# Patient Record
Sex: Male | Born: 1980 | Race: White | Hispanic: No | Marital: Single | Smoking: Current every day smoker
Health system: Southern US, Community
[De-identification: ages and names within clinical notes are randomized; demographics above are authoritative.]

## PROBLEM LIST (undated history)

## (undated) DIAGNOSIS — H3320 Serous retinal detachment, unspecified eye: Secondary | ICD-10-CM

## (undated) DIAGNOSIS — J42 Unspecified chronic bronchitis: Secondary | ICD-10-CM

## (undated) DIAGNOSIS — E162 Hypoglycemia, unspecified: Secondary | ICD-10-CM

## (undated) DIAGNOSIS — Z9001 Acquired absence of eye: Secondary | ICD-10-CM

## (undated) DIAGNOSIS — I456 Pre-excitation syndrome: Secondary | ICD-10-CM

## (undated) DIAGNOSIS — J45909 Unspecified asthma, uncomplicated: Secondary | ICD-10-CM

## (undated) HISTORY — PX: CARDIAC CATHETERIZATION: SHX172

## (undated) HISTORY — PX: ENUCLEATION: SHX628

---

## 2009-03-01 ENCOUNTER — Emergency Department: Payer: Self-pay | Admitting: Unknown Physician Specialty

## 2009-05-25 ENCOUNTER — Emergency Department: Payer: Self-pay | Admitting: Emergency Medicine

## 2010-07-11 ENCOUNTER — Emergency Department: Payer: Self-pay | Admitting: Emergency Medicine

## 2011-06-04 ENCOUNTER — Observation Stay: Payer: Self-pay | Admitting: Specialist

## 2011-09-23 ENCOUNTER — Emergency Department: Payer: Self-pay | Admitting: Emergency Medicine

## 2012-01-04 ENCOUNTER — Emergency Department: Payer: Self-pay | Admitting: Emergency Medicine

## 2012-01-04 LAB — CBC
HCT: 44.9 % (ref 40.0–52.0)
HGB: 15.5 g/dL (ref 13.0–18.0)
MCH: 30.3 pg (ref 26.0–34.0)
MCHC: 34.5 g/dL (ref 32.0–36.0)
MCV: 88 fL (ref 80–100)
RBC: 5.12 10*6/uL (ref 4.40–5.90)
RDW: 14.3 % (ref 11.5–14.5)

## 2012-01-04 LAB — COMPREHENSIVE METABOLIC PANEL
Albumin: 3.8 g/dL (ref 3.4–5.0)
Anion Gap: 13 (ref 7–16)
BUN: 11 mg/dL (ref 7–18)
Bilirubin,Total: 0.4 mg/dL (ref 0.2–1.0)
Chloride: 107 mmol/L (ref 98–107)
Co2: 22 mmol/L (ref 21–32)
Creatinine: 1 mg/dL (ref 0.60–1.30)
EGFR (African American): >60
Potassium: 3.7 mmol/L (ref 3.5–5.1)
SGPT (ALT): 35 U/L
Sodium: 142 mmol/L (ref 136–145)
Total Protein: 7 g/dL (ref 6.4–8.2)

## 2012-01-04 LAB — PROTIME-INR: INR: 1

## 2012-01-04 LAB — CK TOTAL AND CKMB (NOT AT ARMC)
CK, Total: 166 U/L (ref 35–232)
CK-MB: 1.1 ng/mL (ref 0.5–3.6)

## 2012-01-04 LAB — APTT: Activated PTT: 39.2 secs — ABNORMAL HIGH (ref 23.6–35.9)

## 2012-01-30 ENCOUNTER — Emergency Department: Payer: Self-pay | Admitting: *Deleted

## 2012-07-16 ENCOUNTER — Emergency Department: Payer: Self-pay | Admitting: Emergency Medicine

## 2013-01-12 ENCOUNTER — Emergency Department: Payer: Self-pay | Admitting: Emergency Medicine

## 2013-01-30 LAB — MAGNESIUM: Magnesium: 2.3 mg/dL

## 2013-01-30 LAB — COMPREHENSIVE METABOLIC PANEL
Alkaline Phosphatase: 90 U/L (ref 50–136)
Anion Gap: 7 (ref 7–16)
BUN: 9 mg/dL (ref 7–18)
Bilirubin,Total: 0.3 mg/dL (ref 0.2–1.0)
Calcium, Total: 8.5 mg/dL (ref 8.5–10.1)
Chloride: 109 mmol/L — ABNORMAL HIGH (ref 98–107)
Co2: 26 mmol/L (ref 21–32)
Creatinine: 0.84 mg/dL (ref 0.60–1.30)
EGFR (African American): >60
EGFR (Non-African Amer.): >60
Glucose: 109 mg/dL — ABNORMAL HIGH (ref 65–99)
Potassium: 3.8 mmol/L (ref 3.5–5.1)
SGOT(AST): 22 U/L (ref 15–37)

## 2013-01-30 LAB — CBC
HGB: 15.3 g/dL (ref 13.0–18.0)
MCH: 30.2 pg (ref 26.0–34.0)
MCHC: 33.9 g/dL (ref 32.0–36.0)
Platelet: 237 10*3/uL (ref 150–440)
RDW: 13.6 % (ref 11.5–14.5)

## 2013-01-30 LAB — CK TOTAL AND CKMB (NOT AT ARMC)
CK, Total: 122 U/L (ref 35–232)
CK-MB: 0.7 ng/mL (ref 0.5–3.6)

## 2013-01-30 LAB — LIPASE, BLOOD: Lipase: 225 U/L (ref 73–393)

## 2013-01-31 LAB — DRUG SCREEN, URINE
Barbiturates, Ur Screen: NEGATIVE (ref ?–200)
Cocaine Metabolite,Ur ~~LOC~~: NEGATIVE (ref ?–300)
MDMA (Ecstasy)Ur Screen: NEGATIVE (ref ?–500)
Tricyclic, Ur Screen: NEGATIVE (ref ?–1000)

## 2013-01-31 LAB — URINALYSIS, COMPLETE
Bilirubin,UR: NEGATIVE
Blood: NEGATIVE
Glucose,UR: NEGATIVE mg/dL (ref 0–75)
Leukocyte Esterase: NEGATIVE
Nitrite: NEGATIVE
WBC UR: <1 /HPF (ref 0–5)

## 2013-02-01 ENCOUNTER — Inpatient Hospital Stay: Payer: Self-pay | Admitting: Internal Medicine

## 2013-02-01 LAB — HEMOGLOBIN A1C: Hemoglobin A1C: 5.4 % (ref 4.2–6.3)

## 2013-02-01 LAB — FOLATE: Folic Acid: 6.5 ng/mL (ref 3.1–100.0)

## 2013-04-13 ENCOUNTER — Emergency Department: Payer: Self-pay | Admitting: Emergency Medicine

## 2013-04-13 LAB — BASIC METABOLIC PANEL
Anion Gap: 5 — ABNORMAL LOW (ref 7–16)
BUN: 12 mg/dL (ref 7–18)
Creatinine: 1.52 mg/dL — ABNORMAL HIGH (ref 0.60–1.30)
EGFR (African American): >60
EGFR (Non-African Amer.): >60
Glucose: 104 mg/dL — ABNORMAL HIGH (ref 65–99)
Osmolality: 283 (ref 275–301)
Sodium: 142 mmol/L (ref 136–145)

## 2013-04-13 LAB — URINALYSIS, COMPLETE
Bacteria: NONE SEEN
Blood: NEGATIVE
Glucose,UR: NEGATIVE mg/dL (ref 0–75)
Ketone: NEGATIVE
Leukocyte Esterase: NEGATIVE
Ph: 6 (ref 4.5–8.0)
Protein: NEGATIVE
Squamous Epithelial: <1
WBC UR: 1 /HPF (ref 0–5)

## 2013-04-13 LAB — CBC
HCT: 47.3 % (ref 40.0–52.0)
HGB: 16 g/dL (ref 13.0–18.0)
MCV: 88 fL (ref 80–100)
Platelet: 233 10*3/uL (ref 150–440)
RBC: 5.36 10*6/uL (ref 4.40–5.90)
WBC: 9.8 10*3/uL (ref 3.8–10.6)

## 2013-06-17 ENCOUNTER — Emergency Department: Payer: Self-pay | Admitting: Emergency Medicine

## 2014-10-19 ENCOUNTER — Emergency Department: Payer: Self-pay | Admitting: Emergency Medicine

## 2014-10-19 LAB — BASIC METABOLIC PANEL
Anion Gap: 4 — ABNORMAL LOW (ref 7–16)
BUN: 8 mg/dL (ref 7–18)
CALCIUM: 8.3 mg/dL — AB (ref 8.5–10.1)
CHLORIDE: 107 mmol/L (ref 98–107)
Co2: 29 mmol/L (ref 21–32)
Creatinine: 0.96 mg/dL (ref 0.60–1.30)
EGFR (Non-African Amer.): >60
Glucose: 115 mg/dL — ABNORMAL HIGH (ref 65–99)
OSMOLALITY: 279 (ref 275–301)
Potassium: 3.8 mmol/L (ref 3.5–5.1)
Sodium: 140 mmol/L (ref 136–145)

## 2014-10-19 LAB — CBC WITH DIFFERENTIAL/PLATELET
BASOS ABS: 0.2 10*3/uL — AB (ref 0.0–0.1)
Basophil %: 1.6 %
EOS ABS: 0 10*3/uL (ref 0.0–0.7)
EOS PCT: 0.1 %
HCT: 48.9 % (ref 40.0–52.0)
HGB: 16 g/dL (ref 13.0–18.0)
LYMPHS ABS: 0.9 10*3/uL — AB (ref 1.0–3.6)
Lymphocyte %: 7.1 %
MCH: 29.6 pg (ref 26.0–34.0)
MCHC: 32.6 g/dL (ref 32.0–36.0)
MCV: 91 fL (ref 80–100)
MONO ABS: 0.7 x10 3/mm (ref 0.2–1.0)
MONOS PCT: 5.4 %
NEUTROS ABS: 10.4 10*3/uL — AB (ref 1.4–6.5)
Neutrophil %: 85.8 %
PLATELETS: 189 10*3/uL (ref 150–440)
RBC: 5.4 10*6/uL (ref 4.40–5.90)
RDW: 13.2 % (ref 11.5–14.5)
WBC: 12.1 10*3/uL — AB (ref 3.8–10.6)
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
## 2015-02-16 NOTE — Discharge Summary (Signed)
PATIENT NAME:  Drexel IhaRKER, Jag MR#:  [id number] DATE OF BIRTH:  1981-04-18  DATE OF ADMISSION:  02/01/2013 DATE OF DISCHARGE:  02/02/2013  ADMISSION DIAGNOSIS:  Bilateral lower extremity weakness.   DISCHARGE DIAGNOSES: 1.  Bilateral lower extremity weakness with lumbago. 2.  History of Wolff-Parkinson-White.   CONSULTS: Neurology.   IMAGING: The patient had a MRI of the cervical spine and thoracic spine that showed no acute fracture or acute pathology.  MRI of the brain showed no acute intracranial hemorrhage. It did show left globe abnormal signal. A process such as retinal detachment and hemorrhage could present in this fashion. MRI of the lumbar spine mild L4-L5 disk space degeneration with tiny central disk protrusion. Vitamin D level was low at 12.4. Vitamin B12 is 375. Folic acid is 6.5.   HOSPITAL COURSE: This is a 34 year old male, who presented with lower extremity weakness. For further details, please refer to the history and physical.  1.  Lower extremity weakness and lumbago. The patient altering physical exam findings. He was complaining of back pain and back spasms and with that he had associated lower extremity weakness. He was consulted by neurology. He had several imaging studies including MRI of the thoracic, cervical and lumbar spine, all of which are pretty unremarkable. His MRI showed no acute intracranial hemorrhage or plaques; however, he did have some abnormal findings in the left eye. He has had issues with the left eye. He says he cannot see out of it for some time now. We did request follow up with outpatient ophthalmology for that. He was seen by physical therapy. He actually was walking quite well with a walker. We did start baclofen for his muscle spasms.  2.  History of WPW, status post ablation.  3.  Vitamin D deficiency. We did discharge him with vitamin D oral replacement.   DISCHARGE MEDICATIONS:  1.  Baclofen 5 mg t.i.d.  2.  Percocet 1 tablet q. 8 hours p.r.n.  pain, 5/325 mg.  3.  Vitamin D 400 international units 2 tablets daily.   Discharged with home health physical therapy for gait training.   DISCHARGE DIET: Regular.   DISCHARGE FOLLOWUP: The patient will follow up with Dr. Cristopher PeruHemang Shah in 1 to 2 weeks. He will also need a primary care physician and he will also follow up in 1 week with ophthalmology.   The patient was stable for discharge with home health.  TIME SPENT: 35 minutes.   ____________________________ Janyth ContesSital P. Juliene PinaMody, MD spm:aw D: 02/03/2013 13:27:30 ET T: 02/03/2013 13:43:49 ET JOB#: [id number]  cc: Deasia Chiu P. Juliene PinaMody, MD, <Dictator> Hemang K. Sherryll BurgerShah, MD Janyth ContesSITAL P Zahrah Sutherlin MD ELECTRONICALLY SIGNED 02/03/2013 19:43

## 2015-02-16 NOTE — Consult Note (Signed)
Brief Consult Note: Diagnosis: BIL LOWER EXTREMITY WEAKNESS (PARAPLEGIA), BOWEL CONSTIPATION, LEFT UE weakness, mild hyper-reflexia on left LE.   Patient was seen by consultant.   Consult note dictated.   Comments: pt with inconsistant history to different providers. neg lumbar spine MRI, will do c-spine and thoracic spine MRI w/without contrast. - labs - Vit D, ESR, Vit B12, TSH, folate, HgA1c. - aggree with PT - check post void residual (discussed with nurse) - will follow.  Electronic Signatures: Ray Church (MD)  (Signed 07-Apr-14 20:28)  Authored: Brief Consult Note   Last Updated: 07-Apr-14 20:28 by Ray Church (MD)

## 2016-06-22 ENCOUNTER — Encounter: Payer: Self-pay | Admitting: *Deleted

## 2016-06-22 ENCOUNTER — Emergency Department: Payer: Medicare Other

## 2016-06-22 ENCOUNTER — Emergency Department
Admission: EM | Admit: 2016-06-22 | Discharge: 2016-06-22 | Disposition: A | Discharge status: Home / Self Care | Payer: Medicare Other | Attending: Student in an Organized Health Care Education/Training Program | Admitting: Student in an Organized Health Care Education/Training Program

## 2016-06-22 DIAGNOSIS — S40011A Contusion of right shoulder, initial encounter: Secondary | ICD-10-CM

## 2016-06-22 DIAGNOSIS — S199XXA Unspecified injury of neck, initial encounter: Secondary | ICD-10-CM | POA: Diagnosis present

## 2016-06-22 DIAGNOSIS — K047 Periapical abscess without sinus: Secondary | ICD-10-CM

## 2016-06-22 DIAGNOSIS — S0083XA Contusion of other part of head, initial encounter: Secondary | ICD-10-CM | POA: Diagnosis not present

## 2016-06-22 DIAGNOSIS — S1093XA Contusion of unspecified part of neck, initial encounter: Secondary | ICD-10-CM

## 2016-06-22 DIAGNOSIS — J45909 Unspecified asthma, uncomplicated: Secondary | ICD-10-CM | POA: Diagnosis not present

## 2016-06-22 DIAGNOSIS — S40012A Contusion of left shoulder, initial encounter: Secondary | ICD-10-CM

## 2016-06-22 DIAGNOSIS — F17210 Nicotine dependence, cigarettes, uncomplicated: Secondary | ICD-10-CM | POA: Diagnosis not present

## 2016-06-22 DIAGNOSIS — Y939 Activity, unspecified: Secondary | ICD-10-CM | POA: Diagnosis not present

## 2016-06-22 DIAGNOSIS — S161XXA Strain of muscle, fascia and tendon at neck level, initial encounter: Secondary | ICD-10-CM | POA: Diagnosis not present

## 2016-06-22 DIAGNOSIS — R6884 Jaw pain: Secondary | ICD-10-CM

## 2016-06-22 DIAGNOSIS — S0003XA Contusion of scalp, initial encounter: Secondary | ICD-10-CM

## 2016-06-22 DIAGNOSIS — Y929 Unspecified place or not applicable: Secondary | ICD-10-CM | POA: Insufficient documentation

## 2016-06-22 DIAGNOSIS — Y999 Unspecified external cause status: Secondary | ICD-10-CM | POA: Diagnosis not present

## 2016-06-22 HISTORY — DX: Unspecified asthma, uncomplicated: J45.909

## 2016-06-22 HISTORY — DX: Unspecified chronic bronchitis: J42

## 2016-06-22 HISTORY — DX: Hypoglycemia, unspecified: E16.2

## 2016-06-22 HISTORY — DX: Pre-excitation syndrome: I45.6

## 2016-06-22 MED ORDER — TRAMADOL HCL 50 MG PO TABS: 50.0000 mg | ORAL_TABLET | Freq: Four times a day (QID) | ORAL | 0 refills | Status: DC | PRN

## 2016-06-22 MED ORDER — CLINDAMYCIN HCL 150 MG PO CAPS: ORAL_CAPSULE | ORAL | 0 refills | Status: AC

## 2016-06-22 NOTE — ED Notes (Signed)
NAD noted at this time. Pt states understanding of D/C instructions. Pt ambulatory to the lobby at this time.
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
## 2016-06-22 NOTE — Discharge Instructions (Addendum)
[facility] if any continued problems. Begin taking tramadol as needed for severe pain and continue aspirin as needed for inflammation. Also begin taking clindamycin for your dental abscess. You need to contact a dentist for a appointment. Use ice or heat to your muscles as needed for muscle pain.

## 2017-03-23 ENCOUNTER — Emergency Department
Admission: EM | Admit: 2017-03-23 | Discharge: 2017-03-24 | Disposition: A | Discharge status: Home / Self Care | Payer: Medicare Other | Attending: Emergency Medicine | Admitting: Emergency Medicine

## 2017-03-23 ENCOUNTER — Encounter: Payer: Self-pay | Admitting: *Deleted

## 2017-03-23 ENCOUNTER — Emergency Department: Payer: Medicare Other

## 2017-03-23 DIAGNOSIS — W208XXA Other cause of strike by thrown, projected or falling object, initial encounter: Secondary | ICD-10-CM | POA: Diagnosis not present

## 2017-03-23 DIAGNOSIS — Y939 Activity, unspecified: Secondary | ICD-10-CM | POA: Insufficient documentation

## 2017-03-23 DIAGNOSIS — J45909 Unspecified asthma, uncomplicated: Secondary | ICD-10-CM | POA: Diagnosis not present

## 2017-03-23 DIAGNOSIS — S5012XA Contusion of left forearm, initial encounter: Secondary | ICD-10-CM | POA: Diagnosis not present

## 2017-03-23 DIAGNOSIS — F17210 Nicotine dependence, cigarettes, uncomplicated: Secondary | ICD-10-CM | POA: Diagnosis not present

## 2017-03-23 DIAGNOSIS — S40022A Contusion of left upper arm, initial encounter: Secondary | ICD-10-CM

## 2017-03-23 DIAGNOSIS — Z79899 Other long term (current) drug therapy: Secondary | ICD-10-CM | POA: Diagnosis not present

## 2017-03-23 DIAGNOSIS — Y999 Unspecified external cause status: Secondary | ICD-10-CM | POA: Insufficient documentation

## 2017-03-23 DIAGNOSIS — S59912A Unspecified injury of left forearm, initial encounter: Secondary | ICD-10-CM | POA: Diagnosis present

## 2017-03-23 DIAGNOSIS — Y929 Unspecified place or not applicable: Secondary | ICD-10-CM | POA: Insufficient documentation

## 2017-03-23 MED ORDER — TRAMADOL HCL 50 MG PO TABS: 50.0000 mg | ORAL_TABLET | Freq: Four times a day (QID) | ORAL | 0 refills | Status: AC | PRN
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
## 2017-03-23 NOTE — ED Triage Notes (Signed)
Pt dropped a tool box on left arm, pt complains of left arm pain, positive radial pulse , pt is able to move fingers, swelling noted to left arm

## 2017-04-05 ENCOUNTER — Emergency Department
Admission: EM | Admit: 2017-04-05 | Discharge: 2017-04-05 | Disposition: A | Discharge status: Home / Self Care | Payer: Medicare Other | Attending: Emergency Medicine | Admitting: Emergency Medicine

## 2017-04-05 ENCOUNTER — Encounter: Payer: Self-pay | Admitting: Emergency Medicine

## 2017-04-05 ENCOUNTER — Emergency Department: Payer: Medicare Other

## 2017-04-05 DIAGNOSIS — M7918 Myalgia, other site: Secondary | ICD-10-CM

## 2017-04-05 DIAGNOSIS — T07XXXA Unspecified multiple injuries, initial encounter: Secondary | ICD-10-CM | POA: Insufficient documentation

## 2017-04-05 DIAGNOSIS — J45909 Unspecified asthma, uncomplicated: Secondary | ICD-10-CM | POA: Insufficient documentation

## 2017-04-05 DIAGNOSIS — S5012XA Contusion of left forearm, initial encounter: Secondary | ICD-10-CM | POA: Diagnosis not present

## 2017-04-05 DIAGNOSIS — Y998 Other external cause status: Secondary | ICD-10-CM | POA: Diagnosis not present

## 2017-04-05 DIAGNOSIS — Y9355 Activity, bike riding: Secondary | ICD-10-CM | POA: Insufficient documentation

## 2017-04-05 DIAGNOSIS — M542 Cervicalgia: Secondary | ICD-10-CM | POA: Diagnosis not present

## 2017-04-05 DIAGNOSIS — S0990XA Unspecified injury of head, initial encounter: Secondary | ICD-10-CM | POA: Insufficient documentation

## 2017-04-05 DIAGNOSIS — Y929 Unspecified place or not applicable: Secondary | ICD-10-CM | POA: Insufficient documentation

## 2017-04-05 DIAGNOSIS — F17210 Nicotine dependence, cigarettes, uncomplicated: Secondary | ICD-10-CM | POA: Diagnosis not present

## 2017-04-05 MED ORDER — TRAMADOL HCL 50 MG PO TABS: 50.0000 mg | ORAL_TABLET | Freq: Four times a day (QID) | ORAL | 0 refills | Status: AC | PRN

## 2017-04-05 MED ORDER — DIAZEPAM 2 MG PO TABS
2.0000 mg | ORAL_TABLET | Freq: Once | ORAL | Status: AC
  Administered 2017-04-05: 2 mg via ORAL
  Filled 2017-04-05: qty 1

## 2017-04-05 MED ORDER — KETOROLAC TROMETHAMINE 60 MG/2ML IM SOLN
60.0000 mg | Freq: Once | INTRAMUSCULAR | Status: AC
  Administered 2017-04-05: 60 mg via INTRAMUSCULAR
  Filled 2017-04-05: qty 2

## 2017-04-05 MED ORDER — TRAMADOL HCL 50 MG PO TABS
50.0000 mg | ORAL_TABLET | Freq: Once | ORAL | Status: AC
  Administered 2017-04-05: 50 mg via ORAL
  Filled 2017-04-05: qty 1

## 2017-04-05 MED ORDER — ETODOLAC 200 MG PO CAPS: 200.0000 mg | ORAL_CAPSULE | Freq: Three times a day (TID) | ORAL | 0 refills | Status: AC

## 2017-04-05 MED ORDER — DIAZEPAM 2 MG PO TABS: 2.0000 mg | ORAL_TABLET | Freq: Four times a day (QID) | ORAL | 0 refills | Status: DC | PRN

## 2017-04-05 MED ORDER — LIDOCAINE 5 % EX PTCH
1.0000 | MEDICATED_PATCH | CUTANEOUS | Status: DC
  Administered 2017-04-05: 1 via TRANSDERMAL
  Filled 2017-04-05: qty 1
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
## 2017-04-05 NOTE — Discharge Instructions (Signed)
Please follow up with your primary care phsyician

## 2017-04-05 NOTE — ED Notes (Addendum)
Patient discharge and follow up information reviewed with patient by ED nursing staff and patient given the opportunity to ask questions pertaining to ED visit and discharge plan of care. Patient advised that should symptoms not continue to improve, resolve entirely, or should new symptoms develop then a follow up visit with their PCP or a return visit to the ED may be warranted. Patient verbalized consent and understanding of discharge plan of care including potential need for further evaluation. Patient being discharged in stable condition per attending ED physician on duty.   Pt is in room awaiting his sister to come and pick him up; per this RN's judgement, pt not waiting in ED lobby.
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
## 2017-04-05 NOTE — ED Notes (Signed)
Pt waiting to be picked up by his sister.

## 2017-04-23 ENCOUNTER — Emergency Department
Admission: EM | Admit: 2017-04-23 | Discharge: 2017-04-23 | Disposition: A | Discharge status: Home / Self Care | Payer: Medicare Other | Attending: Emergency Medicine | Admitting: Emergency Medicine

## 2017-04-23 ENCOUNTER — Encounter: Payer: Self-pay | Admitting: Emergency Medicine

## 2017-04-23 ENCOUNTER — Emergency Department: Payer: Medicare Other

## 2017-04-23 DIAGNOSIS — J45909 Unspecified asthma, uncomplicated: Secondary | ICD-10-CM | POA: Insufficient documentation

## 2017-04-23 DIAGNOSIS — F17210 Nicotine dependence, cigarettes, uncomplicated: Secondary | ICD-10-CM | POA: Insufficient documentation

## 2017-04-23 DIAGNOSIS — M545 Low back pain, unspecified: Secondary | ICD-10-CM

## 2017-04-23 MED ORDER — DIAZEPAM 5 MG PO TABS
5.0000 mg | ORAL_TABLET | Freq: Once | ORAL | Status: AC
  Administered 2017-04-23: 5 mg via ORAL

## 2017-04-23 MED ORDER — DIAZEPAM 5 MG PO TABS
ORAL_TABLET | ORAL | Status: AC
  Filled 2017-04-23: qty 1

## 2017-04-23 MED ORDER — MORPHINE SULFATE (PF) 4 MG/ML IV SOLN
4.0000 mg | Freq: Once | INTRAVENOUS | Status: AC
  Administered 2017-04-23: 4 mg via INTRAMUSCULAR

## 2017-04-23 MED ORDER — DIAZEPAM 5 MG PO TABS: 5.0000 mg | ORAL_TABLET | Freq: Three times a day (TID) | ORAL | 0 refills | Status: AC | PRN

## 2017-04-23 MED ORDER — MORPHINE SULFATE (PF) 4 MG/ML IV SOLN
INTRAVENOUS | Status: AC
  Filled 2017-04-23: qty 1

## 2017-04-23 MED ORDER — OXYCODONE-ACETAMINOPHEN 5-325 MG PO TABS: 1.0000 | ORAL_TABLET | ORAL | 0 refills | Status: AC | PRN

## 2017-04-23 NOTE — Discharge Instructions (Signed)
1. You may take medicines as needed for pain and muscle spasms (Percocet/Valium #15). 2. Apply moist heat to affected area several times daily. 3. Return to the ER for worsening symptoms, persistent vomiting, difficult breathing or other concerns.
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
## 2017-04-23 NOTE — ED Notes (Signed)
Patient transported to X-ray 
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
## 2017-04-29 ENCOUNTER — Emergency Department
Admission: EM | Admit: 2017-04-29 | Discharge: 2017-04-29 | Disposition: A | Discharge status: Home / Self Care | Payer: Medicare Other | Attending: Emergency Medicine | Admitting: Emergency Medicine

## 2017-04-29 ENCOUNTER — Emergency Department: Payer: Medicare Other

## 2017-04-29 DIAGNOSIS — F17210 Nicotine dependence, cigarettes, uncomplicated: Secondary | ICD-10-CM | POA: Diagnosis not present

## 2017-04-29 DIAGNOSIS — Z79899 Other long term (current) drug therapy: Secondary | ICD-10-CM | POA: Insufficient documentation

## 2017-04-29 DIAGNOSIS — R51 Headache: Secondary | ICD-10-CM | POA: Diagnosis present

## 2017-04-29 DIAGNOSIS — J45909 Unspecified asthma, uncomplicated: Secondary | ICD-10-CM | POA: Insufficient documentation

## 2017-04-29 DIAGNOSIS — E860 Dehydration: Secondary | ICD-10-CM | POA: Insufficient documentation

## 2017-04-29 DIAGNOSIS — R42 Dizziness and giddiness: Secondary | ICD-10-CM | POA: Diagnosis not present

## 2017-04-29 LAB — BASIC METABOLIC PANEL
Anion gap: 7 (ref 5–15)
BUN: 17 mg/dL (ref 6–20)
CHLORIDE: 109 mmol/L (ref 101–111)
CO2: 24 mmol/L (ref 22–32)
Calcium: 9.3 mg/dL (ref 8.9–10.3)
Creatinine, Ser: 1.13 mg/dL (ref 0.61–1.24)
GFR calc Af Amer: >60 mL/min (ref >60)
GFR calc non Af Amer: >60 mL/min (ref >60)
Glucose, Bld: 99 mg/dL (ref 65–99)
POTASSIUM: 3.8 mmol/L (ref 3.5–5.1)
SODIUM: 140 mmol/L (ref 135–145)

## 2017-04-29 LAB — CBC
HEMATOCRIT: 46.8 % (ref 40.0–52.0)
Hemoglobin: 16.1 g/dL (ref 13.0–18.0)
MCH: 30.3 pg (ref 26.0–34.0)
MCHC: 34.4 g/dL (ref 32.0–36.0)
MCV: 88 fL (ref 80.0–100.0)
Platelets: 233 10*3/uL (ref 150–440)
RBC: 5.32 MIL/uL (ref 4.40–5.90)
RDW: 13.4 % (ref 11.5–14.5)
WBC: 9.4 10*3/uL (ref 3.8–10.6)

## 2017-04-29 LAB — TROPONIN I: Troponin I: <0.03 ng/mL (ref <0.03)

## 2017-04-29 MED ORDER — SODIUM CHLORIDE 0.9 % IV BOLUS (SEPSIS)
1000.0000 mL | Freq: Once | INTRAVENOUS | Status: AC
  Administered 2017-04-29: 1000 mL via INTRAVENOUS

## 2017-04-29 NOTE — ED Notes (Addendum)
Pt states dizziness has improved since arrival, denies being dizzy at this time or in pain.
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
## 2017-04-29 NOTE — ED Provider Notes (Signed)
[facility] Emergency Department Provider Note  ____________________________________________  Time seen: Approximately 4:01 AM  I have reviewed the triage vital signs and the nursing notes.   HISTORY  Chief Complaint Headache and Near Syncope    HPI Malik Gilbert is a 36 y.o. male who reports that around 84 PM tonight he got dizzy while walking around outside and passed out. He reports that he is having occasional sharp pain in the left side of the head without vision changes vomiting or numbness tingling or weakness. No preceding pain syndrome prior to the syncope. Reports he's been eating and drinking pretty normally recently but was walking around outside today for many hours.     Past Medical History:  Diagnosis Date  . Asthma   . Chronic bronchitis (HCC)   . Hypoglycemia   . WPW (Wolff-Parkinson-White syndrome)      There are no active problems to display for this patient.    Past Surgical History:  Procedure Laterality Date  . CARDIAC CATHETERIZATION       Prior to Admission medications   Medication Sig Start Date End Date Taking? Authorizing Provider  clindamycin (CLEOCIN) 150 MG capsule Take 2 capsule qid 06/22/16   Bridget Hartshorn L, PA-C  diazepam (VALIUM) 5 MG tablet Take 1 tablet (5 mg total) by mouth every 8 (eight) hours as needed for anxiety. 04/23/17   Irean Hong, MD  etodolac (LODINE) 200 MG capsule Take 1 capsule (200 mg total) by mouth every 8 (eight) hours. 04/05/17   Rebecka Apley, MD  oxyCODONE-acetaminophen (ROXICET) 5-325 MG tablet Take 1 tablet by mouth every 4 (four) hours as needed for severe pain. 04/23/17   Irean Hong, MD  traMADol (ULTRAM) 50 MG tablet Take 1 tablet (50 mg total) by mouth every 6 (six) hours as needed. 04/05/17   Rebecka Apley, MD     Allergies Bee venom; Ibuprofen; Penicillins; and Tea   No family history on file.  Social History Social History  Substance Use Topics  . Smoking  status: Current Every Day Smoker    Packs/day: 1.00    Types: Cigarettes  . Smokeless tobacco: Never Used  . Alcohol use No    Review of Systems  Constitutional:   No fever or chills.  ENT:   No sore throat. No rhinorrhea. Cardiovascular:   No chest pain or syncope. Respiratory:   No dyspnea or cough. Gastrointestinal:   Negative for abdominal pain, vomiting and diarrhea.  Musculoskeletal:   Negative for focal pain or swelling All other systems reviewed and are negative except as documented above in ROS and HPI.  ____________________________________________   PHYSICAL EXAM:  VITAL SIGNS: ED Triage Vitals  Enc Vitals Group     BP 04/29/17 0130 125/81     Pulse Rate 04/29/17 0130 89     Resp 04/29/17 0130 18     Temp 04/29/17 0130 98.6 F (37 C)     Temp Source 04/29/17 0132 Oral     SpO2 04/29/17 0130 99 %     Weight 04/29/17 0131 200 lb (90.7 kg)     Height 04/29/17 0131 5\' 6"  (1.676 m)     Head Circumference --      Peak Flow --      Pain Score 04/29/17 0132 10     Pain Loc --      Pain Edu? --      Excl. in GC? --     Vital signs reviewed, nursing  assessments reviewed.   Constitutional:   Alert and oriented. Well appearing and in no distress. Eyes:   Only right eye is functional. 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 No scleral icterus.  EOMI. No nystagmus. No conjunctival pallor. Pupil reacts to light ENT   Head:   Normocephalic and atraumatic.   Nose:   No congestion/rhinnorhea.    Mouth/Throat:   MMM, no pharyngeal erythema. No peritonsillar mass.    Neck:   No meningismus. Full ROM Hematological/Lymphatic/Immunilogical:   No cervical lymphadenopathy. Cardiovascular:   RRR. Symmetric bilateral radial and DP pulses.  No murmurs.  Respiratory:   Normal respiratory effort without tachypnea/retractions. Breath sounds are clear and equal bilaterally. No wheezes/rales/rhonchi. Gastrointestinal:   Soft and nontender. Non distended.  There is no CVA tenderness.  No rebound, rigidity, or guarding. Genitourinary:   deferred Musculoskeletal:   Normal range of motion in all extremities. No joint effusions.  No lower extremity tenderness.  No edema. Neurologic:   Normal speech and language.  Motor grossly intact. No gross focal neurologic deficits are appreciated.  Skin:    Skin is warm, dry and intact. No rash noted.  No petechiae, purpura, or bullae.  ____________________________________________    LABS (pertinent positives/negatives) (all labs ordered are listed, but only abnormal results are displayed) Labs Reviewed  BASIC METABOLIC PANEL  CBC  TROPONIN I  URINALYSIS, COMPLETE (UACMP) WITH MICROSCOPIC  CBG MONITORING, ED   ____________________________________________   EKG  Interpreted by me Normal sinus rhythm rate of 91. Normal axis intervals and QRS. Normal ST segments. Isolated T-wave inversion in lead 3 which is nonspecific.  ____________________________________________    RADIOLOGY  Ct Head Wo Contrast  Result Date: 04/29/2017 CLINICAL DATA:  Acute onset of left-sided headache. Altered mental status. Initial encounter. EXAM: CT HEAD WITHOUT CONTRAST TECHNIQUE: Contiguous axial images were obtained from the base of the skull through the vertex without intravenous contrast. COMPARISON:  CT of the head performed 04/05/2017 FINDINGS: Brain: No evidence of acute infarction, hemorrhage, hydrocephalus, extra-axial collection or mass lesion/mass effect. The posterior fossa, including the cerebellum, brainstem and fourth ventricle, is within normal limits. The third and lateral ventricles, and basal ganglia are unremarkable in appearance. The cerebral hemispheres are symmetric in appearance, with normal gray-white differentiation. No mass effect or midline shift is seen. Vascular: No hyperdense vessel or unexpected calcification. Skull: There is no evidence of fracture; visualized osseous structures are  unremarkable in appearance. Sinuses/Orbits: The visualized portions of the orbits are within normal limits. The paranasal sinuses and mastoid air cells are well-aerated. 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 The visualized portions of the orbits are within normal limits. The paranasal sinuses and mastoid air cells are well-aerated. IMPRESSION: Unremarkable noncontrast CT of the head. Electronically Signed   By: Roanna RaiderJeffery  Chang M.D.   On: 04/29/2017 02:41    ____________________________________________   PROCEDURES Procedures  ____________________________________________   INITIAL IMPRESSION / ASSESSMENT AND PLAN / ED COURSE  Pertinent labs & imaging results that were available during my care of the patient were reviewed by me and considered in my medical decision making (see chart for details).  Patient well appearing no acute distress, presents with episode of dizziness and passing out related to prolonged walking around outside, up to 12 hours today. It was approximately 95 outside today. Headache is not concerning.Considering the patient's symptoms, medical history, and physical examination today, I have low suspicion for ischemic stroke, intracranial hemorrhage, meningitis, encephalitis, carotid  or vertebral dissection, venous sinus thrombosis, MS, intracranial hypertension, glaucoma, CRAO, CRVO, or temporal arteritis. Likely this is heat related illness and dehydration. Patient feeling much better after being able to rest in the emergency department and receive IV fluids. We'll discharge home after reassuring workup.      ____________________________________________   FINAL CLINICAL IMPRESSION(S) / ED DIAGNOSES  Final diagnoses:  Dehydration  Dizziness      New Prescriptions   No medications on file     Portions of this note were generated with dragon dictation software. Dictation errors may occur despite best attempts  at proofreading.    Sharman Cheek, MD 04/29/17 [phone]
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
## 2017-04-29 NOTE — ED Notes (Signed)
Ride has arrived for pt to be discharged.

## 2017-04-29 NOTE — ED Triage Notes (Signed)
Pt also reports that he thinks that he "blacked out" because he doesn't remember a period of 2 hours of the night.

## 2017-04-29 NOTE — ED Notes (Signed)
Pt states he is messaging his sister at this time for a ride home.
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
## 2019-12-08 ENCOUNTER — Emergency Department: Payer: Medicare Other

## 2019-12-08 ENCOUNTER — Emergency Department
Admission: EM | Admit: 2019-12-08 | Discharge: 2019-12-09 | Disposition: A | Discharge status: Home / Self Care | Payer: Medicare Other | Attending: Emergency Medicine | Admitting: Emergency Medicine

## 2019-12-08 ENCOUNTER — Encounter: Payer: Self-pay | Admitting: Emergency Medicine

## 2019-12-08 ENCOUNTER — Other Ambulatory Visit: Payer: Self-pay

## 2019-12-08 DIAGNOSIS — R52 Pain, unspecified: Secondary | ICD-10-CM | POA: Diagnosis present

## 2019-12-08 DIAGNOSIS — Y9389 Activity, other specified: Secondary | ICD-10-CM | POA: Diagnosis not present

## 2019-12-08 DIAGNOSIS — R109 Unspecified abdominal pain: Secondary | ICD-10-CM | POA: Diagnosis not present

## 2019-12-08 DIAGNOSIS — M791 Myalgia, unspecified site: Secondary | ICD-10-CM | POA: Insufficient documentation

## 2019-12-08 DIAGNOSIS — Y999 Unspecified external cause status: Secondary | ICD-10-CM | POA: Insufficient documentation

## 2019-12-08 DIAGNOSIS — Y92410 Unspecified street and highway as the place of occurrence of the external cause: Secondary | ICD-10-CM | POA: Insufficient documentation

## 2019-12-08 DIAGNOSIS — R55 Syncope and collapse: Secondary | ICD-10-CM | POA: Insufficient documentation

## 2019-12-08 DIAGNOSIS — S63502A Unspecified sprain of left wrist, initial encounter: Secondary | ICD-10-CM | POA: Insufficient documentation

## 2019-12-08 DIAGNOSIS — F17210 Nicotine dependence, cigarettes, uncomplicated: Secondary | ICD-10-CM | POA: Diagnosis not present

## 2019-12-08 DIAGNOSIS — Z79899 Other long term (current) drug therapy: Secondary | ICD-10-CM | POA: Diagnosis not present

## 2019-12-08 HISTORY — DX: Serous retinal detachment, unspecified eye: H33.20

## 2019-12-08 HISTORY — DX: Acquired absence of eye: Z90.01

## 2019-12-08 LAB — CBC WITH DIFFERENTIAL/PLATELET
Abs Immature Granulocytes: 0.06 10*3/uL (ref 0.00–0.07)
Basophils Absolute: 0.1 10*3/uL (ref 0.0–0.1)
Basophils Relative: 0 %
Eosinophils Absolute: 0.1 10*3/uL (ref 0.0–0.5)
Eosinophils Relative: 1 %
HCT: 46.9 % (ref 39.0–52.0)
Hemoglobin: 16 g/dL (ref 13.0–17.0)
Immature Granulocytes: 1 %
Lymphocytes Relative: 16 %
Lymphs Abs: 2 10*3/uL (ref 0.7–4.0)
MCH: 30.5 pg (ref 26.0–34.0)
MCHC: 34.1 g/dL (ref 30.0–36.0)
MCV: 89.3 fL (ref 80.0–100.0)
Monocytes Absolute: 0.7 10*3/uL (ref 0.1–1.0)
Monocytes Relative: 6 %
Neutro Abs: 9.5 10*3/uL — ABNORMAL HIGH (ref 1.7–7.7)
Neutrophils Relative %: 76 %
Platelets: 239 10*3/uL (ref 150–400)
RBC: 5.25 MIL/uL (ref 4.22–5.81)
RDW: 14.4 % (ref 11.5–15.5)
WBC: 12.4 10*3/uL — ABNORMAL HIGH (ref 4.0–10.5)
nRBC: 0 % (ref 0.0–0.2)

## 2019-12-08 LAB — ETHANOL: Alcohol, Ethyl (B): <10 mg/dL (ref <10)

## 2019-12-08 MED ORDER — IOHEXOL 300 MG/ML  SOLN
100.0000 mL | Freq: Once | INTRAMUSCULAR | Status: AC | PRN
  Administered 2019-12-08: 23:00:00 100 mL via INTRAVENOUS
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
## 2019-12-09 ENCOUNTER — Encounter: Payer: Self-pay | Admitting: Emergency Medicine

## 2019-12-09 LAB — BASIC METABOLIC PANEL
Anion gap: 6 (ref 5–15)
BUN: 12 mg/dL (ref 6–20)
CO2: 22 mmol/L (ref 22–32)
Calcium: 8.1 mg/dL — ABNORMAL LOW (ref 8.9–10.3)
Chloride: 110 mmol/L (ref 98–111)
Creatinine, Ser: 0.91 mg/dL (ref 0.61–1.24)
GFR calc Af Amer: >60 mL/min (ref >60)
GFR calc non Af Amer: >60 mL/min (ref >60)
Glucose, Bld: 90 mg/dL (ref 70–99)
Potassium: 3.4 mmol/L — ABNORMAL LOW (ref 3.5–5.1)
Sodium: 138 mmol/L (ref 135–145)

## 2019-12-09 MED ORDER — ACETAMINOPHEN 500 MG PO TABS
1000.0000 mg | ORAL_TABLET | Freq: Once | ORAL | Status: AC
  Administered 2019-12-09: 01:00:00 1000 mg via ORAL
  Filled 2019-12-09: qty 2

## 2019-12-09 NOTE — Discharge Instructions (Addendum)

## 2019-12-09 NOTE — ED Notes (Signed)
Pt signed discharge paper on paper not in computer.
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
